# Patient Record
Sex: Female | Born: 2000
Health system: Southern US, Community
[De-identification: ages and names within clinical notes are randomized; demographics above are authoritative.]

## PROBLEM LIST (undated history)

## (undated) DIAGNOSIS — M5136 Other intervertebral disc degeneration, lumbar region: Secondary | ICD-10-CM

## (undated) DIAGNOSIS — J020 Streptococcal pharyngitis: Secondary | ICD-10-CM

## (undated) DIAGNOSIS — M51369 Other intervertebral disc degeneration, lumbar region without mention of lumbar back pain or lower extremity pain: Secondary | ICD-10-CM

## (undated) DIAGNOSIS — M5126 Other intervertebral disc displacement, lumbar region: Secondary | ICD-10-CM

## (undated) DIAGNOSIS — M419 Scoliosis, unspecified: Secondary | ICD-10-CM

---
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
## 2021-01-02 ENCOUNTER — Ambulatory Visit: Admit: 2021-01-02 | Discharge status: Against Medical Advice | Payer: Self-pay

## 2021-01-02 ENCOUNTER — Ambulatory Visit
Admission: EM | Admit: 2021-01-02 | Discharge: 2021-01-02 | Disposition: A | Discharge status: Home / Self Care | Payer: PRIVATE HEALTH INSURANCE | Attending: Emergency Medicine | Admitting: Emergency Medicine

## 2021-01-02 ENCOUNTER — Encounter: Payer: Self-pay | Admitting: Emergency Medicine

## 2021-01-02 ENCOUNTER — Other Ambulatory Visit: Payer: Self-pay

## 2021-01-02 DIAGNOSIS — R220 Localized swelling, mass and lump, head: Secondary | ICD-10-CM | POA: Diagnosis not present

## 2021-01-02 MED ORDER — PREDNISONE 10 MG PO TABS: ORAL_TABLET | ORAL | 0 refills | Status: AC

## 2021-01-02 MED ORDER — CETIRIZINE HCL 10 MG PO CAPS: 10.0000 mg | ORAL_CAPSULE | Freq: Every day | ORAL | 0 refills | Status: AC

## 2021-01-02 NOTE — Discharge Instructions (Signed)
Begin prednisone taper over the next 6 days Daily cetirizine to further help with possible underlying allergic reaction Monitor symptoms, follow-up if not improving

## 2021-01-02 NOTE — ED Triage Notes (Signed)
Pt here for painful lesions around mouth intermittently with pain

## 2021-01-03 NOTE — ED Provider Notes (Signed)
[facility]    CSN: [id number] Arrival date & time: 01/02/21  1441      History   Chief Complaint Chief Complaint  Patient presents with  . Mouth Lesions    HPI Crystal Ochoa is a 20 y.o. female presenting today for evaluation of lip swelling and irritation.  Patient reports that approximately 1 week ago she was eating some spicy chicken and began to develop some discomfort to her lips.  Reports that she has had some intermittent swelling to her lips as well as noticed some bumps around her lips.  Denies any associated itching or pain.  Denies any lesions in mouth or any oral swelling.  She has felt slightly short of breath, denies chest pain.  Denies URI symptoms.  Denies any new lip products.  Denies any other new foods or medicines.  HPI  Past Medical History:  Diagnosis Date  . Bulging lumbar disc   . Scoliosis   . Strep pharyngitis     There are no problems to display for this patient.   History reviewed. No pertinent surgical history.  OB History   No obstetric history on file.      Home Medications    Prior to Admission medications   Medication Sig Start Date End Date Taking? Authorizing Provider  Cetirizine HCl 10 MG CAPS Take 1 capsule (10 mg total) by mouth daily for 10 days. 01/02/21 01/12/21 Yes Clancy Mullarkey C, PA-C  predniSONE (DELTASONE) 10 MG tablet Begin with 6 tabs on day 1, 5 tab on day 2, 4 tab on day 3, 3 tab on day 4, 2 tab on day 5, 1 tab on day 6-take with food 01/02/21  Yes Kaina Orengo C, PA-C  BLISOVI 24 FE 1-20 MG-MCG(24) tablet TAKE 1 TABLET BY MOUTH EVERY DAY 04/03/18   Constant, Peggy, MD  ibuprofen (ADVIL,MOTRIN) 600 MG tablet Take 600 mg by mouth every 8 (eight) hours as needed for headache, moderate pain or cramping.    [provider]    Family History History reviewed. No pertinent family history.  Social History Social History   Tobacco Use  . Smoking status: Never Smoker  . Smokeless tobacco: Never Used   Substance Use Topics  . Alcohol use: No  . Drug use: No     Allergies   Patient has no known allergies.   Review of Systems Review of Systems  Constitutional: Negative for fatigue and fever.  Eyes: Negative for visual disturbance.  Respiratory: Negative for shortness of breath.   Cardiovascular: Negative for chest pain.  Gastrointestinal: Negative for abdominal pain, nausea and vomiting.  Musculoskeletal: Negative for arthralgias and joint swelling.  Skin: Negative for color change, rash and wound.  Neurological: Negative for dizziness, weakness, light-headedness and headaches.     Physical Exam Triage Vital Signs ED Triage Vitals  Enc Vitals Group     BP 01/02/21 1633 130/85     Pulse Rate 01/02/21 1633 (!) 103     Resp 01/02/21 1633 16     Temp 01/02/21 1633 98.4 F (36.9 C)     Temp Source 01/02/21 1633 Oral     SpO2 01/02/21 1633 98 %     Weight --      Height --      Head Circumference --      Peak Flow --      Pain Score 01/02/21 1636 4     Pain Loc --      Pain Edu? --  Excl. in GC? --    No data found.  Updated Vital Signs BP 130/85 (BP Location: Left Arm)   Pulse (!) 103   Temp 98.4 F (36.9 C) (Oral)   Resp 16   SpO2 98%   Visual Acuity Right Eye Distance:   Left Eye Distance:   Bilateral Distance:    Right Eye Near:   Left Eye Near:    Bilateral Near:     Physical Exam Vitals and nursing note reviewed.  Constitutional:      Appearance: She is well-developed and well-nourished.     Comments: No acute distress  HENT:     Head: Normocephalic and atraumatic.     Nose: Nose normal.     Mouth/Throat:     Comments: Lips without any obvious swelling or rash, very faint mild papular areas noted to perioral area, slight darkened coloration of lips compared to picture patient shows me on the phone  No oral lesions, no oral swelling Eyes:     Conjunctiva/sclera: Conjunctivae normal.  Cardiovascular:     Rate and Rhythm: Normal rate.   Pulmonary:     Effort: Pulmonary effort is normal. No respiratory distress.     Comments: Breathing comfortably at rest, CTABL, no wheezing, rales or other adventitious sounds auscultated  Speaking in full sentences Abdominal:     General: There is no distension.  Musculoskeletal:        General: Normal range of motion.     Cervical back: Neck supple.  Skin:    General: Skin is warm and dry.  Neurological:     Mental Status: She is alert and oriented to person, place, and time.  Psychiatric:        Mood and Affect: Mood and affect normal.      UC Treatments / Results  Labs (all labs ordered are listed, but only abnormal results are displayed) Labs Reviewed - No data to display  EKG   Radiology No results found.  Procedures Procedures (including critical care time)  Medications Ordered in UC Medications - No data to display  Initial Impression / Assessment and Plan / UC Course  I have reviewed the triage vital signs and the nursing notes.  Pertinent labs & imaging results that were available during my care of the patient were reviewed by me and considered in my medical decision making (see chart for details).     We will treat for possible allergic reaction causing lip swelling, providing prednisone taper, recommending antihistamines and continued close monitoring.  Discussed strict return precautions. Patient verbalized understanding and is agreeable with plan.  Final Clinical Impressions(s) / UC Diagnoses   Final diagnoses:  Lip swelling     Discharge Instructions     Begin prednisone taper over the next 6 days Daily cetirizine to further help with possible underlying allergic reaction Monitor symptoms, follow-up if not improving    ED Prescriptions    Medication Sig Dispense Auth. Provider   predniSONE (DELTASONE) 10 MG tablet Begin with 6 tabs on day 1, 5 tab on day 2, 4 tab on day 3, 3 tab on day 4, 2 tab on day 5, 1 tab on day 6-take with food  21 tablet Caster Fayette C, PA-C   Cetirizine HCl 10 MG CAPS Take 1 capsule (10 mg total) by mouth daily for 10 days. 10 capsule Emilianna Barlowe, [place] C, PA-C     PDMP not reviewed this encounter.   Lew Dawes, PA-C 01/03/21 1148

## 2021-11-19 IMAGING — DX DG FOOT COMPLETE 3+V*R*
3 series · 3 of 3 positions shown · non-contrast
Comparison: Prior imaging not currently available for review.

CLINICAL DATA: Right foot pain. Patient diagnosed with a fracture
or earlier this month with imaging obtained in [place].

EXAM:
RIGHT FOOT COMPLETE - 3+ VIEW

[foot supine dp]
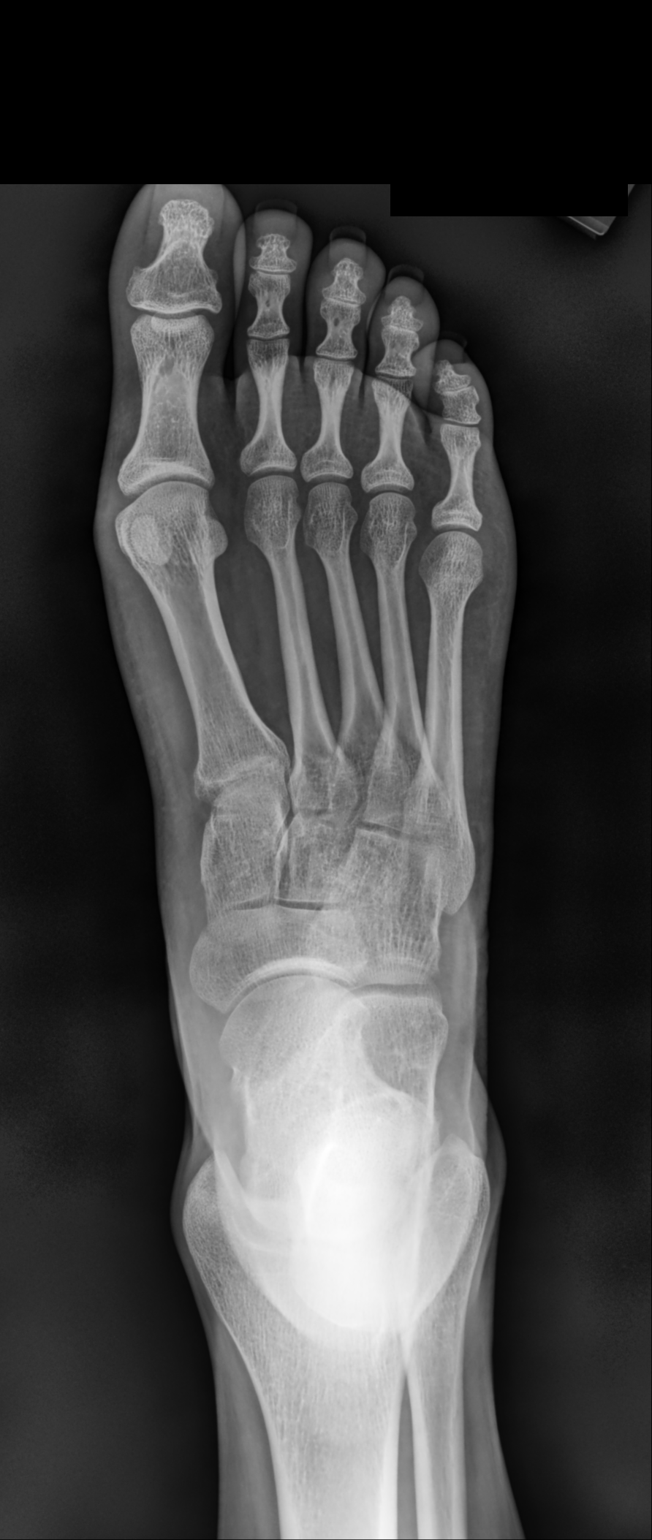

[foot medial oblique]
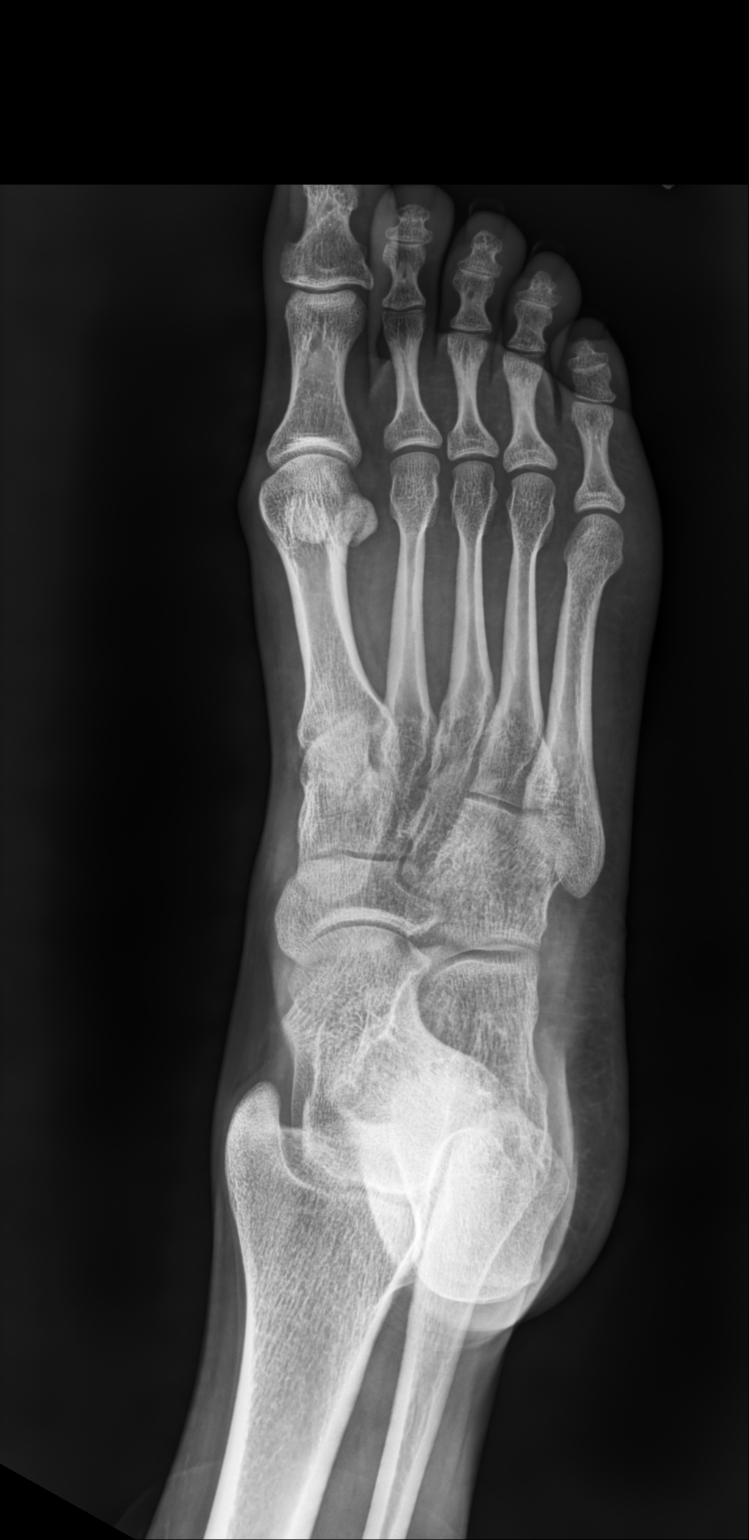

[foot supine lat]
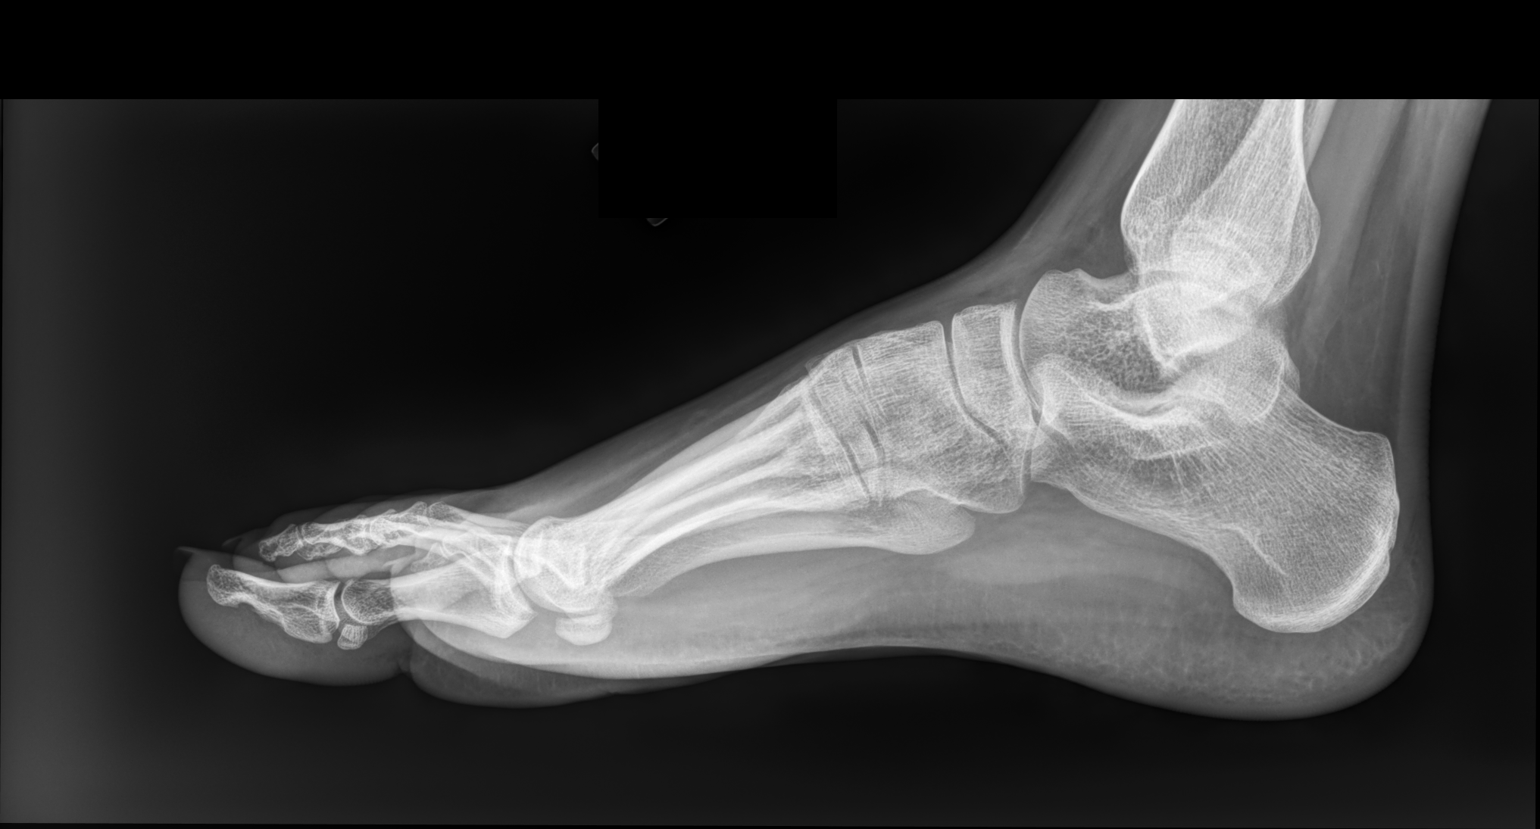

[3 of 3 positions shown; findings below may reference images not displayed]

FINDINGS: There is no evidence of fracture or dislocation. There is no
evidence of arthropathy or other focal bone abnormality. Soft
tissues are unremarkable.
IMPRESSION: Negative.  Specifically, no fracture identified.

## 2022-05-30 ENCOUNTER — Encounter: Payer: Self-pay | Admitting: Obstetrics and Gynecology

## 2022-05-30 ENCOUNTER — Ambulatory Visit (INDEPENDENT_AMBULATORY_CARE_PROVIDER_SITE_OTHER): Payer: Medicaid Other | Admitting: Obstetrics and Gynecology

## 2022-05-30 ENCOUNTER — Other Ambulatory Visit (HOSPITAL_COMMUNITY)
Admission: RE | Admit: 2022-05-30 | Discharge: 2022-05-30 | Disposition: A | Discharge status: Home / Self Care | Payer: Medicaid Other | Source: Ambulatory Visit | Attending: Obstetrics and Gynecology | Admitting: Obstetrics and Gynecology

## 2022-05-30 VITALS — BP 101/63 | HR 108 | Ht 64.0 in | Wt 122.0 lb

## 2022-05-30 DIAGNOSIS — Z113 Encounter for screening for infections with a predominantly sexual mode of transmission: Secondary | ICD-10-CM | POA: Insufficient documentation

## 2022-05-30 MED ORDER — BLISOVI 24 FE 1-20 MG-MCG(24) PO TABS: 1.0000 | ORAL_TABLET | Freq: Every day | ORAL | 11 refills | Status: DC

## 2022-05-30 NOTE — Progress Notes (Signed)
Pt has hx of severe cramps with cycles. Pt is currently on OCP to help control.  Pt would like to discuss if that's the best treatment.   Pt states she would like std screening and having some vaginal itching.

## 2022-05-30 NOTE — Progress Notes (Signed)
21 yo P0 with BMI 20 presenting today for STI screening. Patient reports a monthly period lasting 4-5 days. She is sexually active using birth control pills for contraception. She denies pelvic pain or abnormal discharge. She denies urinary symptoms.   Past Medical History:  Diagnosis Date   Bulging lumbar disc    Scoliosis    Strep pharyngitis    History reviewed. No pertinent surgical history. History reviewed. No pertinent family history. Social History   Tobacco Use   Smoking status: Never   Smokeless tobacco: Never  Substance Use Topics   Alcohol use: No   Drug use: No   ROS  See pertinent in HPI. All other systems reviewed and non contributory Blood pressure 101/63, pulse (!) 108, height 5\' 4"  (1.626 m), weight 122 lb (55.3 kg).  GENERAL: Well-developed, well-nourished female in no acute distress.  ABDOMEN: Soft, nontender, nondistended. No organomegaly. PELVIC: Normal external female genitalia. Vagina is pink and rugated.  Normal discharge. Normal appearing cervix. Uterus is normal in size. No adnexal mass or tenderness. Chaperone present during the pelvic exam EXTREMITIES: No cyanosis, clubbing, or edema, 2+ distal pulses.  A/P 21 yo  - STI screening per patient request - Patient will be contacted with abnormal results - Pap smear next year - Refill on birth control pill provided

## 2022-05-31 LAB — HIV ANTIBODY (ROUTINE TESTING W REFLEX): HIV Screen 4th Generation wRfx: NONREACTIVE

## 2022-05-31 LAB — RPR: RPR Ser Ql: NONREACTIVE

## 2022-05-31 LAB — CERVICOVAGINAL ANCILLARY ONLY
Bacterial Vaginitis (gardnerella): NEGATIVE
Candida Glabrata: NEGATIVE
Candida Vaginitis: POSITIVE — AB
Chlamydia: NEGATIVE
Comment: NEGATIVE
Comment: NEGATIVE
Comment: NEGATIVE
Comment: NEGATIVE
Comment: NEGATIVE
Comment: NORMAL
Neisseria Gonorrhea: NEGATIVE
Trichomonas: NEGATIVE

## 2022-05-31 LAB — HEPATITIS B SURFACE ANTIGEN: Hepatitis B Surface Ag: NEGATIVE

## 2022-05-31 LAB — HEPATITIS C ANTIBODY: Hep C Virus Ab: NONREACTIVE

## 2022-06-01 MED ORDER — FLUCONAZOLE 150 MG PO TABS: 150.0000 mg | ORAL_TABLET | Freq: Once | ORAL | 0 refills | Status: AC

## 2022-06-01 NOTE — Addendum Note (Signed)
Addended by: Catalina Antigua on: 06/01/2022 08:55 AM   Modules accepted: Orders

## 2022-06-07 ENCOUNTER — Other Ambulatory Visit: Payer: Self-pay | Admitting: *Deleted

## 2022-06-07 DIAGNOSIS — Z308 Encounter for other contraceptive management: Secondary | ICD-10-CM

## 2022-06-07 MED ORDER — BLISOVI 24 FE 1-20 MG-MCG(24) PO TABS: 1.0000 | ORAL_TABLET | Freq: Every day | ORAL | 11 refills | Status: AC

## 2022-06-07 NOTE — Progress Notes (Signed)
Pt reports original RX for OCP's was filled using alternate med at pharmacy. Request DAW sent to different pharmacy that carries brand name.
